# Patient Record
Sex: Female | Born: 1955 | Race: Black or African American | Hispanic: No | Marital: Single | Smoking: Never smoker
Health system: Southern US, Community
[De-identification: ages and names within clinical notes are randomized; demographics above are authoritative.]

## PROBLEM LIST (undated history)

## (undated) DIAGNOSIS — E7800 Pure hypercholesterolemia, unspecified: Secondary | ICD-10-CM

## (undated) DIAGNOSIS — I10 Essential (primary) hypertension: Secondary | ICD-10-CM

## (undated) DIAGNOSIS — E119 Type 2 diabetes mellitus without complications: Secondary | ICD-10-CM

## (undated) HISTORY — PX: ABDOMINAL HYSTERECTOMY: SHX81

---

## 2003-03-02 ENCOUNTER — Other Ambulatory Visit: Payer: Self-pay

## 2004-03-12 ENCOUNTER — Ambulatory Visit: Payer: Self-pay | Admitting: Unknown Physician Specialty

## 2005-03-13 ENCOUNTER — Ambulatory Visit: Payer: Self-pay | Admitting: Unknown Physician Specialty

## 2006-03-14 ENCOUNTER — Ambulatory Visit: Payer: Self-pay | Admitting: Unknown Physician Specialty

## 2007-03-18 ENCOUNTER — Ambulatory Visit: Payer: Self-pay | Admitting: Unknown Physician Specialty

## 2008-04-01 ENCOUNTER — Ambulatory Visit: Payer: Self-pay | Admitting: Unknown Physician Specialty

## 2008-12-29 ENCOUNTER — Ambulatory Visit: Payer: Self-pay | Admitting: Gastroenterology

## 2009-05-31 ENCOUNTER — Ambulatory Visit: Payer: Self-pay | Admitting: Unknown Physician Specialty

## 2010-07-10 ENCOUNTER — Ambulatory Visit: Payer: Self-pay | Admitting: Unknown Physician Specialty

## 2011-05-01 ENCOUNTER — Ambulatory Visit: Payer: Self-pay | Admitting: Internal Medicine

## 2011-05-06 ENCOUNTER — Ambulatory Visit: Payer: Self-pay | Admitting: Internal Medicine

## 2011-05-06 LAB — CBC CANCER CENTER
Basophil #: 0 x10 3/mm (ref 0.0–0.1)
Eosinophil: 2 %
HCT: 37.4 % (ref 35.0–47.0)
Lymphocyte #: 2.1 x10 3/mm (ref 1.0–3.6)
Lymphocyte %: 52.3 %
MCH: 31.6 pg (ref 26.0–34.0)
MCHC: 33.9 g/dL (ref 32.0–36.0)
Monocyte #: 0.2 x10 3/mm (ref 0.0–0.7)
Monocyte %: 5.6 %
Monocytes: 7 %
Neutrophil #: 1.6 x10 3/mm (ref 1.4–6.5)
Neutrophil %: 40.4 %
Platelet: 186 x10 3/mm (ref 150–440)
RDW: 14 % (ref 11.5–14.5)
Segmented Neutrophils: 33 %

## 2011-05-06 LAB — RETICULOCYTES
Absolute Retic Count: 0.06 10*6/uL (ref 0.024–0.084)
Reticulocyte: 1.5 % (ref 0.5–1.5)

## 2011-05-06 LAB — FOLATE: Folic Acid: 31.9 ng/mL (ref 3.1–100.0)

## 2011-05-06 LAB — APTT: Activated PTT: 33.8 secs (ref 23.6–35.9)

## 2011-05-06 LAB — IRON AND TIBC
Iron Saturation: 38 %
Unbound Iron-Bind.Cap.: 172 ug/dL

## 2011-05-06 LAB — PROTIME-INR
INR: 1
Prothrombin Time: 13.2 secs (ref 11.5–14.7)

## 2011-05-17 ENCOUNTER — Ambulatory Visit: Payer: Self-pay | Admitting: Internal Medicine

## 2015-11-30 ENCOUNTER — Other Ambulatory Visit: Payer: Self-pay | Admitting: Family Medicine

## 2015-11-30 DIAGNOSIS — Z1231 Encounter for screening mammogram for malignant neoplasm of breast: Secondary | ICD-10-CM

## 2015-12-13 ENCOUNTER — Other Ambulatory Visit: Payer: Self-pay | Admitting: Family Medicine

## 2015-12-13 ENCOUNTER — Ambulatory Visit
Admission: RE | Admit: 2015-12-13 | Discharge: 2015-12-13 | Disposition: A | Discharge status: Home / Self Care | Payer: BLUE CROSS/BLUE SHIELD | Source: Ambulatory Visit | Attending: Family Medicine | Admitting: Family Medicine

## 2015-12-13 DIAGNOSIS — Z1231 Encounter for screening mammogram for malignant neoplasm of breast: Secondary | ICD-10-CM | POA: Diagnosis present

## 2016-09-18 ENCOUNTER — Encounter: Payer: Self-pay | Admitting: Emergency Medicine

## 2016-09-18 ENCOUNTER — Ambulatory Visit (INDEPENDENT_AMBULATORY_CARE_PROVIDER_SITE_OTHER): Payer: BLUE CROSS/BLUE SHIELD

## 2016-09-18 ENCOUNTER — Ambulatory Visit
Admission: EM | Admit: 2016-09-18 | Discharge: 2016-09-18 | Disposition: A | Discharge status: Home / Self Care | Payer: BLUE CROSS/BLUE SHIELD | Attending: Family Medicine | Admitting: Family Medicine

## 2016-09-18 DIAGNOSIS — M7582 Other shoulder lesions, left shoulder: Secondary | ICD-10-CM | POA: Diagnosis not present

## 2016-09-18 HISTORY — DX: Essential (primary) hypertension: I10

## 2016-09-18 MED ORDER — NAPROXEN 500 MG PO TABS: 500.0000 mg | ORAL_TABLET | Freq: Two times a day (BID) | ORAL | 0 refills | Status: AC

## 2016-09-18 NOTE — ED Provider Notes (Signed)
CSN: [id number]     Arrival date & time 09/18/16  0802 History   First MD Initiated Contact with Patient 09/18/16 [phone]     Chief Complaint  Patient presents with  . Shoulder Pain    left   (Consider location/radiation/quality/duration/timing/severity/associated sxs/prior Treatment) HPI   This 61 year old female who presents with left shoulder pain that she's had off and on for a couple of months. 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 Many times she does not have help to assist her. He states that the pain will radiate into her upper arm. She is having difficulty lifting her arm away from her side up. She is able to move it through a range of motion with the assistance of another hand. She also states that sometimes the pain will affect her right shoulder and other times left. Neck is also bothersome at times. She denies any upper extremity radicular symptoms and denies any numbness or tingling. She states that heat does help the pain as does Tylenol but always seems to return.     Past Medical History:  Diagnosis Date  . Hypertension    Past Surgical History:  Procedure Laterality Date  . ABDOMINAL HYSTERECTOMY     Family History  Problem Relation Age of Onset  . Breast cancer Sister 17  . Cancer Mother   . Hypertension Mother   . Hypertension Father    Social History  Substance Use Topics  . Smoking status: Never Smoker  . Smokeless tobacco: Never Used  . Alcohol use No   OB History    No data available     Review of Systems  Constitutional: Positive for activity change. Negative for chills, fatigue and fever.  Musculoskeletal: Positive for arthralgias and neck stiffness.  All other systems reviewed and are negative.   Allergies  Patient has no known allergies.  Home Medications   Prior to Admission medications   Medication Sig Start Date End Date Taking?  Authorizing Provider  amLODipine (NORVASC) 5 MG tablet Take 5 mg by mouth daily.   Yes [provider]  benazepril-hydrochlorthiazide (LOTENSIN HCT) 10-12.5 MG tablet Take 1 tablet by mouth daily.   Yes [provider]  naproxen (NAPROSYN) 500 MG tablet Take 1 tablet (500 mg total) by mouth 2 (two) times daily with a meal. 09/18/16   Lutricia Feil, PA-C   Meds Ordered and Administered this Visit  Medications - No data to display  BP (!) 150/73 (BP Location: Right Arm)   Pulse 73   Temp 98.7 F (37.1 C) (Oral)   Resp 16   Ht 5\' 10"  (1.778 m)   Wt 190 lb (86.2 kg)   SpO2 100%   BMI 27.26 kg/m  No data found.   Physical Exam  Constitutional: She is oriented to person, place, and time. She appears well-developed and well-nourished. No distress.  HENT:  Head: Normocephalic and atraumatic.  Eyes: EOM are normal. Pupils are equal, round, and reactive to light.  Neck: Normal range of motion. Neck supple.  Examination of the cervical spine shows fairly good range of motion without discomfort. She does have tenderness of the trapezii bilaterally. Upper extremity sensation is intact. Range of motion please refer to musculoskeletal  Musculoskeletal:  Examination of the shoulders shows full range of motion on the right without discomfort. Patient is able to arm raise on the right without  difficulty. Has a negative empty can test on the right. Left shoulder range of motion shows the patient to not be unable to actively move her arm beyond 25 of abduction I extension. External rotation beyond 15 causes her pain in the shoulder. Hip internal rotation is full and comfortable. She has a positive empty can test on the left against resistance but is hazy able to hold it solidly against gravity. Upper extremity sensation is intact. Strength is weak due to patient's discomfort.  Neurological: She is alert and oriented to person, place, and time.  Skin: Skin is dry. She is not  diaphoretic.  Psychiatric: She has a normal mood and affect. Her behavior is normal. Thought content normal.  Nursing note and vitals reviewed.   Urgent Care Course     Procedures (including critical care time)  Labs Review Labs Reviewed - No data to display  Imaging Review Dg Shoulder Left  Result Date: 09/18/2016 CLINICAL DATA:  Left shoulder pain, no known injury, initial encounter EXAM: LEFT SHOULDER - 2+ VIEW COMPARISON:  None. FINDINGS: There is no evidence of fracture or dislocation. There is no evidence of arthropathy or other focal bone abnormality. Soft tissues are unremarkable. IMPRESSION: No acute abnormality noted. Electronically Signed   By: Alcide CleverMark  Lukens M.D.   On: 09/18/2016 09:15     Visual Acuity Review  Right Eye Distance:   Left Eye Distance:   Bilateral Distance:    Right Eye Near:   Left Eye Near:    Bilateral Near:         MDM   1. Infraspinatus tendinitis, left    Discharge Medication List as of 09/18/2016  9:48 AM    START taking these medications   Details  naproxen (NAPROSYN) 500 MG tablet Take 1 tablet (500 mg total) by mouth 2 (two) times daily with a meal., Starting Wed 09/18/2016, Normal      Plan: 1. Test/x-ray results and diagnosis reviewed with patient 2. rx as per orders; risks, benefits, potential side effects reviewed with patient 3. Recommend supportive treatment with Symptom avoidance and rest as necessary. Patient was instructed in pendulum exercises to help reduce risk of adhesive capsulitis. If she does not improve with the conservative treatment I have recommended that she follow-up with her primary care for possible referral to orthopedics. 4. F/u prn if symptoms worsen or don't improve     Lutricia FeilRoemer, Phillipa Morden P, PA-C 09/18/16 [id number]

## 2016-09-18 NOTE — ED Triage Notes (Signed)
Patient c/o left shoulder pain off and on for couple of months.

## 2016-11-05 ENCOUNTER — Other Ambulatory Visit: Payer: Self-pay | Admitting: Family Medicine

## 2016-11-05 DIAGNOSIS — Z1231 Encounter for screening mammogram for malignant neoplasm of breast: Secondary | ICD-10-CM

## 2016-12-13 ENCOUNTER — Ambulatory Visit
Admission: RE | Admit: 2016-12-13 | Discharge: 2016-12-13 | Disposition: A | Discharge status: Home / Self Care | Payer: BLUE CROSS/BLUE SHIELD | Source: Ambulatory Visit | Attending: Family Medicine | Admitting: Family Medicine

## 2016-12-13 DIAGNOSIS — Z1231 Encounter for screening mammogram for malignant neoplasm of breast: Secondary | ICD-10-CM | POA: Insufficient documentation

## 2016-12-19 ENCOUNTER — Other Ambulatory Visit: Payer: Self-pay | Admitting: Family Medicine

## 2016-12-19 DIAGNOSIS — R928 Other abnormal and inconclusive findings on diagnostic imaging of breast: Secondary | ICD-10-CM

## 2016-12-19 DIAGNOSIS — N6489 Other specified disorders of breast: Secondary | ICD-10-CM

## 2016-12-26 ENCOUNTER — Ambulatory Visit
Admission: RE | Admit: 2016-12-26 | Discharge: 2016-12-26 | Disposition: A | Discharge status: Home / Self Care | Payer: BLUE CROSS/BLUE SHIELD | Source: Ambulatory Visit | Attending: Family Medicine | Admitting: Family Medicine

## 2016-12-26 DIAGNOSIS — R928 Other abnormal and inconclusive findings on diagnostic imaging of breast: Secondary | ICD-10-CM

## 2016-12-26 DIAGNOSIS — N6489 Other specified disorders of breast: Secondary | ICD-10-CM | POA: Diagnosis not present

## 2017-12-19 ENCOUNTER — Other Ambulatory Visit: Payer: Self-pay | Admitting: Family Medicine

## 2017-12-19 DIAGNOSIS — Z1231 Encounter for screening mammogram for malignant neoplasm of breast: Secondary | ICD-10-CM

## 2018-01-07 ENCOUNTER — Ambulatory Visit
Admission: RE | Admit: 2018-01-07 | Discharge: 2018-01-07 | Disposition: A | Discharge status: Home / Self Care | Payer: BLUE CROSS/BLUE SHIELD | Source: Ambulatory Visit | Attending: Family Medicine | Admitting: Family Medicine

## 2018-01-07 DIAGNOSIS — Z1231 Encounter for screening mammogram for malignant neoplasm of breast: Secondary | ICD-10-CM | POA: Insufficient documentation

## 2018-09-29 ENCOUNTER — Other Ambulatory Visit: Payer: Self-pay | Admitting: Internal Medicine

## 2018-09-29 DIAGNOSIS — Z1231 Encounter for screening mammogram for malignant neoplasm of breast: Secondary | ICD-10-CM

## 2019-01-11 ENCOUNTER — Ambulatory Visit
Admission: RE | Admit: 2019-01-11 | Discharge: 2019-01-11 | Disposition: A | Discharge status: Home / Self Care | Payer: BC Managed Care – PPO | Source: Ambulatory Visit | Attending: Internal Medicine | Admitting: Internal Medicine

## 2019-01-11 DIAGNOSIS — Z1231 Encounter for screening mammogram for malignant neoplasm of breast: Secondary | ICD-10-CM | POA: Diagnosis present

## 2019-09-30 ENCOUNTER — Other Ambulatory Visit: Payer: Self-pay | Admitting: Internal Medicine

## 2019-09-30 DIAGNOSIS — Z1231 Encounter for screening mammogram for malignant neoplasm of breast: Secondary | ICD-10-CM

## 2020-01-17 ENCOUNTER — Other Ambulatory Visit: Payer: Self-pay

## 2020-01-17 ENCOUNTER — Ambulatory Visit
Admission: RE | Admit: 2020-01-17 | Discharge: 2020-01-17 | Disposition: A | Discharge status: Home / Self Care | Payer: BC Managed Care – PPO | Source: Ambulatory Visit | Attending: Internal Medicine | Admitting: Internal Medicine

## 2020-01-17 DIAGNOSIS — Z1231 Encounter for screening mammogram for malignant neoplasm of breast: Secondary | ICD-10-CM | POA: Insufficient documentation

## 2021-02-20 ENCOUNTER — Other Ambulatory Visit: Payer: Self-pay | Admitting: Gerontology

## 2021-02-20 DIAGNOSIS — Z1231 Encounter for screening mammogram for malignant neoplasm of breast: Secondary | ICD-10-CM

## 2021-04-25 ENCOUNTER — Ambulatory Visit
Admission: RE | Admit: 2021-04-25 | Discharge: 2021-04-25 | Disposition: A | Discharge status: Home / Self Care | Payer: BC Managed Care – PPO | Source: Ambulatory Visit | Attending: Gerontology | Admitting: Gerontology

## 2021-04-25 ENCOUNTER — Other Ambulatory Visit: Payer: Self-pay

## 2021-04-25 DIAGNOSIS — Z1231 Encounter for screening mammogram for malignant neoplasm of breast: Secondary | ICD-10-CM | POA: Insufficient documentation

## 2021-05-18 ENCOUNTER — Other Ambulatory Visit: Payer: Self-pay | Admitting: Internal Medicine

## 2021-05-18 DIAGNOSIS — K529 Noninfective gastroenteritis and colitis, unspecified: Secondary | ICD-10-CM

## 2021-05-21 ENCOUNTER — Other Ambulatory Visit: Payer: Self-pay | Admitting: Nurse Practitioner

## 2021-05-21 DIAGNOSIS — K529 Noninfective gastroenteritis and colitis, unspecified: Secondary | ICD-10-CM

## 2021-05-31 ENCOUNTER — Ambulatory Visit: Payer: BC Managed Care – PPO

## 2021-05-31 ENCOUNTER — Ambulatory Visit
Admission: RE | Admit: 2021-05-31 | Discharge: 2021-05-31 | Disposition: A | Discharge status: Home / Self Care | Payer: BC Managed Care – PPO | Source: Ambulatory Visit | Attending: Internal Medicine | Admitting: Internal Medicine

## 2021-05-31 DIAGNOSIS — K529 Noninfective gastroenteritis and colitis, unspecified: Secondary | ICD-10-CM

## 2021-05-31 MED ORDER — IOPAMIDOL (ISOVUE-300) INJECTION 61%
100.0000 mL | Freq: Once | INTRAVENOUS | Status: AC | PRN
  Administered 2021-05-31: 100 mL via INTRAVENOUS

## 2021-07-12 ENCOUNTER — Other Ambulatory Visit: Payer: Self-pay

## 2021-07-12 ENCOUNTER — Ambulatory Visit
Admission: EM | Admit: 2021-07-12 | Discharge: 2021-07-12 | Disposition: A | Discharge status: Home / Self Care | Payer: BC Managed Care – PPO

## 2021-07-12 DIAGNOSIS — J40 Bronchitis, not specified as acute or chronic: Secondary | ICD-10-CM | POA: Diagnosis not present

## 2021-07-12 DIAGNOSIS — J329 Chronic sinusitis, unspecified: Secondary | ICD-10-CM | POA: Diagnosis not present

## 2021-07-12 MED ORDER — AMOXICILLIN-POT CLAVULANATE 875-125 MG PO TABS: 1.0000 | ORAL_TABLET | Freq: Two times a day (BID) | ORAL | 0 refills | Status: DC

## 2021-07-12 NOTE — Discharge Instructions (Addendum)
-  Start the antibiotic-Augmentin (amoxicillin-clavulanate), 1 pill every 12 hours for 7 days.  You can take this with food like with breakfast and dinner. ?-Follow-up if symptoms worsen instead of improve: shortness of breath, new chest pain, new fevers, coughing up red or brown, etc.  ?-You can also try an over-the-counter allergy medication if sneezing persists  ?

## 2021-07-12 NOTE — ED Provider Notes (Signed)
?[facility] ? ? ? ?CSN: [id number] ?Arrival date & time: 07/12/21  1850 ? ? ?  ? ?History   ?Chief Complaint ?Chief Complaint  ?Patient presents with  ? Sore Throat  ?   ?  ? Cough  ? ? ?HPI ?Danielle Keller is a 66 y.o. female presenting with worsening symptoms following 10 days of a viral URI.  Describes initially with nasal congestion, sore throat, cough.  Symptoms have improved, but in the last day they have once again worsened with purulent postnasal drip and nonproductive cough.  States that the sore throat has resolved, and she is not having shortness of breath, chest pain, fever/chills.  States Claritin provides temporary relief and the symptoms return. ? ?HPI ? ?Past Medical History:  ?Diagnosis Date  ? Hypertension   ? ? ?There are no problems to display for this patient. ? ? ?Past Surgical History:  ?Procedure Laterality Date  ? ABDOMINAL HYSTERECTOMY    ? ? ?OB History   ?No obstetric history on file. ?  ? ? ? ?Home Medications   ? ?Prior to Admission medications   ?Medication Sig Start Date End Date Taking? Authorizing Provider  ?amoxicillin-clavulanate (AUGMENTIN) 875-125 MG tablet Take 1 tablet by mouth every 12 (twelve) hours. 07/12/21  Yes Rhys Martini, PA-C  ?pravastatin (PRAVACHOL) 20 MG tablet Take by mouth. 02/20/21  Yes [provider]  ?amLODipine (NORVASC) 5 MG tablet Take 5 mg by mouth daily.    [provider]  ?benazepril-hydrochlorthiazide (LOTENSIN HCT) 10-12.5 MG tablet Take 1 tablet by mouth daily.    [provider]  ?metFORMIN (GLUCOPHAGE) 500 MG tablet SMARTSIG:1 Tablet(s) By Mouth Every Evening 06/07/21   [provider]  ?naproxen (NAPROSYN) 500 MG tablet Take 1 tablet (500 mg total) by mouth 2 (two) times daily with a meal. 09/18/16   Lutricia Feil, PA-C  ? ? ?Family History ?Family History  ?Problem Relation Age of Onset  ? Breast cancer Sister 71  ? Cancer Mother   ? Hypertension Mother   ? Hypertension Father   ? ? ?Social  History ?Social History  ? ?Tobacco Use  ? Smoking status: Never  ? Smokeless tobacco: Never  ?Vaping Use  ? Vaping Use: Never used  ?Substance Use Topics  ? Alcohol use: No  ? Drug use: No  ? ? ? ?Allergies   ?Patient has no known allergies. ? ? ?Review of Systems ?Review of Systems  ?Constitutional:  Negative for appetite change, chills and fever.  ?HENT:  Negative for congestion, ear pain, rhinorrhea, sinus pressure, sinus pain and sore throat.   ?Eyes:  Negative for redness and visual disturbance.  ?Respiratory:  Positive for cough. Negative for chest tightness, shortness of breath and wheezing.   ?Cardiovascular:  Negative for chest pain and palpitations.  ?Gastrointestinal:  Negative for abdominal pain, constipation, diarrhea, nausea and vomiting.  ?Genitourinary:  Negative for dysuria, frequency and urgency.  ?Musculoskeletal:  Negative for myalgias.  ?Neurological:  Negative for dizziness, weakness and headaches.  ?Psychiatric/Behavioral:  Negative for confusion.   ?All other systems reviewed and are negative. ? ? ?Physical Exam ?Triage Vital Signs ?ED Triage Vitals [07/12/21 1929]  ?Enc Vitals Group  ?   BP (!) 158/83  ?   Pulse Rate 82  ?   Resp 18  ?   Temp 98.7 ?F (37.1 ?C)  ?   Temp Source Oral  ?   SpO2 100 %  ?   Weight   ?  Height   ?   Head Circumference   ?   Peak Flow   ?   Pain Score   ?   Pain Loc   ?   Pain Edu?   ?   Excl. in GC?   ? ?No data found. ? ?Updated Vital Signs ?BP (!) 158/83 (BP Location: Left Arm)   Pulse 82   Temp 98.7 ?F (37.1 ?C) (Oral)   Resp 18   SpO2 100%  ? ?Visual Acuity ?Right Eye Distance:   ?Left Eye Distance:   ?Bilateral Distance:   ? ?Right Eye Near:   ?Left Eye Near:    ?Bilateral Near:    ? ?Physical Exam ?Vitals reviewed.  ?Constitutional:   ?   General: She is not in acute distress. ?   Appearance: Normal appearance. She is not ill-appearing.  ?HENT:  ?   Head: Normocephalic and atraumatic.  ?   Right Ear: Tympanic membrane, ear canal and external ear  normal. No tenderness. No middle ear effusion. There is no impacted cerumen. Tympanic membrane is not perforated, erythematous, retracted or bulging.  ?   Left Ear: Tympanic membrane, ear canal and external ear normal. No tenderness.  No middle ear effusion. There is no impacted cerumen. Tympanic membrane is not perforated, erythematous, retracted or bulging.  ?   Nose: Nose normal. No congestion.  ?   Mouth/Throat:  ?   Mouth: Mucous membranes are moist.  ?   Pharynx: Uvula midline. Posterior oropharyngeal erythema present. No oropharyngeal exudate.  ?   Comments: Cobblestoning posterior pharynx. Tonsils are small. ?Eyes:  ?   Extraocular Movements: Extraocular movements intact.  ?   Pupils: Pupils are equal, round, and reactive to light.  ?Cardiovascular:  ?   Rate and Rhythm: Normal rate and regular rhythm.  ?   Heart sounds: Normal heart sounds.  ?Pulmonary:  ?   Effort: Pulmonary effort is normal.  ?   Breath sounds: Normal breath sounds. No decreased breath sounds, wheezing, rhonchi or rales.  ?Abdominal:  ?   Palpations: Abdomen is soft.  ?   Tenderness: There is no abdominal tenderness. There is no guarding or rebound.  ?Lymphadenopathy:  ?   Cervical: No cervical adenopathy.  ?   Right cervical: No superficial cervical adenopathy. ?   Left cervical: No superficial cervical adenopathy.  ?Neurological:  ?   General: No focal deficit present.  ?   Mental Status: She is alert and oriented to person, place, and time.  ?Psychiatric:     ?   Mood and Affect: Mood normal.     ?   Behavior: Behavior normal.     ?   Thought Content: Thought content normal.     ?   Judgment: Judgment normal.  ? ? ? ?UC Treatments / Results  ?Labs ?(all labs ordered are listed, but only abnormal results are displayed) ?Labs Reviewed  ?GROUP A STREP BY PCR  ? ? ?EKG ? ? ?Radiology ?No results found. ? ?Procedures ?Procedures (including critical care time) ? ?Medications Ordered in UC ?Medications - No data to display ? ?Initial  Impression / Assessment and Plan / UC Course  ?I have reviewed the triage vital signs and the nursing notes. ? ?Pertinent labs & imaging results that were available during my care of the patient were reviewed by me and considered in my medical decision making (see chart for details). ? ?  ? ?This patient is a very pleasant 66 y.o. year old female  presenting with acute sinusitis following viral syndrome / secondary sickening syndrome. Afebrile, nontachy. Augmentin sent. Continue daily claritin. ED return precautions discussed. Patient verbalizes understanding and agreement.  ?.  ? ?Final Clinical Impressions(s) / UC Diagnoses  ? ?Final diagnoses:  ?Sinobronchitis  ? ? ? ?Discharge Instructions   ? ?  ?-Start the antibiotic-Augmentin (amoxicillin-clavulanate), 1 pill every 12 hours for 7 days.  You can take this with food like with breakfast and dinner. ?-Follow-up if symptoms worsen instead of improve: shortness of breath, new chest pain, new fevers, coughing up red or brown, etc.  ?-You can also try an over-the-counter allergy medication if sneezing persists  ? ? ?ED Prescriptions   ? ? Medication Sig Dispense Auth. Provider  ? amoxicillin-clavulanate (AUGMENTIN) 875-125 MG tablet Take 1 tablet by mouth every 12 (twelve) hours. 14 tablet Rhys MartiniGraham, Iyanla Eilers E, PA-C  ? ?  ? ?PDMP not reviewed this encounter. ?  ?Rhys MartiniGraham, Stillman Buenger E, PA-C ?07/12/21 1943 ? ?

## 2021-07-12 NOTE — ED Triage Notes (Signed)
Pt reports itching  throat, nasal drainage and cough x 10 days. States she do not have sore throat anymore.  ?

## 2021-12-11 ENCOUNTER — Ambulatory Visit
Admission: EM | Admit: 2021-12-11 | Discharge: 2021-12-11 | Disposition: A | Discharge status: Home / Self Care | Payer: BC Managed Care – PPO | Attending: Family Medicine | Admitting: Family Medicine

## 2021-12-11 DIAGNOSIS — L300 Nummular dermatitis: Secondary | ICD-10-CM | POA: Diagnosis not present

## 2021-12-11 HISTORY — DX: Pure hypercholesterolemia, unspecified: E78.00

## 2021-12-11 HISTORY — DX: Type 2 diabetes mellitus without complications: E11.9

## 2021-12-11 MED ORDER — TRIAMCINOLONE ACETONIDE 0.5 % EX OINT: 1.0000 | TOPICAL_OINTMENT | Freq: Two times a day (BID) | CUTANEOUS | 3 refills | Status: AC

## 2021-12-11 NOTE — Discharge Instructions (Addendum)
Stop by the pharmacy to pick up your steroid ointment.  Apply twice a day for up to 14 days.  If your rash is not improving in the next week please follow-up with your primary care provider/dermatologist.  See information attached.

## 2021-12-11 NOTE — ED Provider Notes (Signed)
[facility]    CSN: [id number] Arrival date & time: 12/11/21  1436      History   Chief Complaint Chief Complaint  Patient presents with   Rash    HPI Danielle Keller is a 66 y.o. female.   HPI  [name] presents for itchy bilateral foot rash that started about 1 month ago. Has been using hydrocortisone, lemon butter and Aveeno lotion.  Treatments helped somewhat with the itching.  Denies fever, swelling, vomiting, diarrhea, abdominal pain, headache, sore throat, joint pain and shortness of breath.  She has been eating and drinking, per normal.  She has no other concerns today.     Past Medical History:  Diagnosis Date   Diabetes mellitus without complication (HCC)    High cholesterol    Hypertension     There are no problems to display for this patient.   Past Surgical History:  Procedure Laterality Date   ABDOMINAL HYSTERECTOMY      OB History   No obstetric history on file.      Home Medications    Prior to Admission medications   Medication Sig Start Date End Date Taking? Authorizing Provider  amLODipine (NORVASC) 5 MG tablet Take 5 mg by mouth daily.   Yes [provider]  benazepril-hydrochlorthiazide (LOTENSIN HCT) 10-12.5 MG tablet Take 1 tablet by mouth daily.   Yes [provider]  metFORMIN (GLUCOPHAGE) 500 MG tablet SMARTSIG:1 Tablet(s) By Mouth Every Evening 06/07/21  Yes [provider]  naproxen (NAPROSYN) 500 MG tablet Take 1 tablet (500 mg total) by mouth 2 (two) times daily with a meal. 09/18/16  Yes Lutricia Feil, PA-C  pravastatin (PRAVACHOL) 20 MG tablet Take by mouth. 02/20/21  Yes [provider]  triamcinolone ointment (KENALOG) 0.5 % Apply 1 Application topically 2 (two) times daily. For moderate to severe eczema.  Do not use for more than 1 week at a time. 12/11/21  Yes Katha Cabal, DO    Family History Family History  Problem Relation Age of Onset   Breast cancer Sister 23    Cancer Mother    Hypertension Mother    Hypertension Father     Social History Social History   Tobacco Use   Smoking status: Never   Smokeless tobacco: Never  Vaping Use   Vaping Use: Never used  Substance Use Topics   Alcohol use: No   Drug use: No     Allergies   Patient has no known allergies.   Review of Systems Review of Systems : :negative unless otherwise stated in HPI.      Physical Exam Triage Vital Signs ED Triage Vitals  Enc Vitals Group     BP 12/11/21 1551 130/68     Pulse Rate 12/11/21 1551 61     Resp 12/11/21 1551 18     Temp 12/11/21 1551 98.4 F (36.9 C)     Temp Source 12/11/21 1551 Oral     SpO2 12/11/21 1551 100 %     Weight --      Height --      Head Circumference --      Peak Flow --      Pain Score 12/11/21 1549 0     Pain Loc --      Pain Edu? --      Excl. in GC? --    No data found.  Updated Vital Signs BP 130/68 (BP Location: Left Arm)   Pulse 61  Temp 98.4 F (36.9 C) (Oral)   Resp 18   SpO2 100%   Visual Acuity Right Eye Distance:   Left Eye Distance:   Bilateral Distance:    Right Eye Near:   Left Eye Near:    Bilateral Near:     Physical Exam  GEN: well appearing female in no acute distress  CVS: well perfused  RESP: speaking in full sentences without pause, no respiratory distress  SKIN: Bilateral ankles with raised hyperpigmented patches that are circular and scaly and somewhat blisterlike, she has a couple lesions behind her knee          UC Treatments / Results  Labs (all labs ordered are listed, but only abnormal results are displayed) Labs Reviewed - No data to display  EKG   Radiology No results found.  Procedures Procedures (including critical care time)  Medications Ordered in UC Medications - No data to display  Initial Impression / Assessment and Plan / UC Course  I have reviewed the triage vital signs and the nursing notes.  Pertinent labs & imaging results that were  available during my care of the patient were reviewed by me and considered in my medical decision making (see chart for details).     Patient is a 66 year old female who presents for bilateral foot rash that started about a month ago.  Vital signs stable.  She is afebrile.  She has been using hydrocortisone without relief.  She has history of eczema.  Her exam is concerning for nummular eczema as lesions are both on bilateral feet and behind her knees.  This does not appear to be infectious.  No antibiotics needed at this time.  Possibly vasculitis as she does have some lower extremity swelling.  Treat with Kenalog ointment 0.5%.  Patient to follow-up with her primary care doctor and/or dermatologist if not improving.  Return precautions provided Final Clinical Impressions(s) / UC Diagnoses   Final diagnoses:  Nummular eczema     Discharge Instructions      Stop by the pharmacy to pick up your steroid ointment.  Apply twice a day for up to 14 days.  If your rash is not improving in the next week please follow-up with your primary care provider/dermatologist.  See information attached.       ED Prescriptions     Medication Sig Dispense Auth. Provider   triamcinolone ointment (KENALOG) 0.5 % Apply 1 Application topically 2 (two) times daily. For moderate to severe eczema.  Do not use for more than 1 week at a time. 60 g Katha Cabal, DO      PDMP not reviewed this encounter.   Katha Cabal, DO 12/12/21 0302

## 2021-12-11 NOTE — ED Triage Notes (Signed)
Pt presents with rash to feet and legs x 1 month.  Believes it is eczema exacerbation.  Itching.  Had been given a cream years ago that helped but doesn't have any more and unsure of name.

## 2022-03-12 ENCOUNTER — Other Ambulatory Visit: Payer: Self-pay | Admitting: Gerontology

## 2022-03-12 DIAGNOSIS — Z1231 Encounter for screening mammogram for malignant neoplasm of breast: Secondary | ICD-10-CM

## 2022-05-06 ENCOUNTER — Ambulatory Visit
Admission: RE | Admit: 2022-05-06 | Discharge: 2022-05-06 | Disposition: A | Discharge status: Home / Self Care | Payer: BC Managed Care – PPO | Source: Ambulatory Visit | Attending: Gerontology | Admitting: Gerontology

## 2022-05-06 DIAGNOSIS — Z1231 Encounter for screening mammogram for malignant neoplasm of breast: Secondary | ICD-10-CM | POA: Diagnosis not present

## 2022-05-10 ENCOUNTER — Other Ambulatory Visit: Payer: Self-pay | Admitting: Gerontology

## 2022-05-10 DIAGNOSIS — R928 Other abnormal and inconclusive findings on diagnostic imaging of breast: Secondary | ICD-10-CM

## 2022-05-10 DIAGNOSIS — N6489 Other specified disorders of breast: Secondary | ICD-10-CM

## 2022-05-14 ENCOUNTER — Ambulatory Visit
Admission: RE | Admit: 2022-05-14 | Discharge: 2022-05-14 | Disposition: A | Discharge status: Home / Self Care | Payer: BC Managed Care – PPO | Source: Ambulatory Visit | Attending: Gerontology | Admitting: Gerontology

## 2022-05-14 DIAGNOSIS — N6489 Other specified disorders of breast: Secondary | ICD-10-CM

## 2022-05-14 DIAGNOSIS — R928 Other abnormal and inconclusive findings on diagnostic imaging of breast: Secondary | ICD-10-CM

## 2022-06-21 ENCOUNTER — Ambulatory Visit
Admission: EM | Admit: 2022-06-21 | Discharge: 2022-06-21 | Disposition: A | Discharge status: Home / Self Care | Payer: BC Managed Care – PPO | Attending: Family Medicine | Admitting: Family Medicine

## 2022-06-21 ENCOUNTER — Other Ambulatory Visit: Payer: Self-pay

## 2022-06-21 DIAGNOSIS — J3489 Other specified disorders of nose and nasal sinuses: Secondary | ICD-10-CM | POA: Diagnosis present

## 2022-06-21 DIAGNOSIS — Z1152 Encounter for screening for COVID-19: Secondary | ICD-10-CM | POA: Insufficient documentation

## 2022-06-21 DIAGNOSIS — R059 Cough, unspecified: Secondary | ICD-10-CM | POA: Diagnosis present

## 2022-06-21 DIAGNOSIS — J069 Acute upper respiratory infection, unspecified: Secondary | ICD-10-CM | POA: Diagnosis not present

## 2022-06-21 LAB — RESP PANEL BY RT-PCR (RSV, FLU A&B, COVID)  RVPGX2
Influenza A by PCR: NEGATIVE
Influenza B by PCR: NEGATIVE
Resp Syncytial Virus by PCR: NEGATIVE
SARS Coronavirus 2 by RT PCR: NEGATIVE

## 2022-06-21 MED ORDER — IPRATROPIUM BROMIDE 0.06 % NA SOLN: 2.0000 | Freq: Four times a day (QID) | NASAL | 12 refills | Status: AC

## 2022-06-21 MED ORDER — PROMETHAZINE-DM 6.25-15 MG/5ML PO SYRP: 5.0000 mL | ORAL_SOLUTION | Freq: Four times a day (QID) | ORAL | 0 refills | Status: AC | PRN

## 2022-06-21 NOTE — ED Triage Notes (Signed)
Nasal drainage with Itchy, sore, scratchy throat since Tuesday. Pt states she got some relief from zyrtec but continues to have drainage. Denies fever

## 2022-06-21 NOTE — Discharge Instructions (Signed)
We will contact you if your COVID/influenza/RSV test is positive.  Please quarantine while you wait for the results.  If your test is negative you may resume normal activities.  If your test is positive please continue to quarantine for at least 5 days from your symptom onset or until you are without a fever for at least 24 hours after the medications.    Stop by the pharmacy to pick up your prescriptions.    You can take Tylenol and/or Ibuprofen as needed for fever reduction and pain relief.    For cough: honey 1/2 to 1 teaspoon (you can dilute the honey in water or another fluid).  You can also use guaifenesin and dextromethorphan for cough. You can use a humidifier for chest congestion and cough.  If you don't have a humidifier, you can sit in the bathroom with the hot shower running.      For sore throat: try warm salt water gargles, Mucinex sore throat cough drops or cepacol lozenges, throat spray, warm tea or water with lemon/honey, popsicles or ice, or OTC cold relief medicine for throat discomfort. You can also purchase chloraseptic spray at the pharmacy or dollar store.   For congestion: take a daily anti-histamine like Zyrtec, Claritin, and a oral decongestant, such as pseudoephedrine.  You can also use Flonase 1-2 sprays in each nostril daily. Afrin is also a good option, if you do not have high blood pressure.    It is important to stay hydrated: drink plenty of fluids (water, gatorade/powerade/pedialyte, juices, or teas) to keep your throat moisturized and help further relieve irritation/discomfort.    Return or go to the Emergency Department if symptoms worsen or do not improve in the next few days

## 2022-06-21 NOTE — ED Provider Notes (Signed)
[facility]    CSN: [id number] Arrival date & time: 06/21/22  1652      History   Chief Complaint Chief Complaint  Patient presents with   sinus drainage    HPI Danielle Keller is a 67 y.o. female.   HPI   Danielle Keller presents for sinus drainage and scratchy throat with sneezing, hoarseness and cough. Symptoms started on Tuesday. Took Claritin then Zyrtec which helped some.  Took Tylenol on Wednesday.  No known sick contacts.  She hasn't taken a COVID test.    Fever : no  Chills: no Sore throat: yes   Cough: yes Chest pain: no  Shortness of breath: no  Sputum: yes Nasal congestion: yes  Rhinorrhea: yes Myalgias: no Appetite: normal  Hydration: normal  Abdominal pain: no Nausea: no Vomiting: no Diarrhea: No Rash: Not new  Sleep disturbance: yes  Headache: no      Past Medical History:  Diagnosis Date   Diabetes mellitus without complication (HCC)    High cholesterol    Hypertension     There are no problems to display for this patient.   Past Surgical History:  Procedure Laterality Date   ABDOMINAL HYSTERECTOMY      OB History   No obstetric history on file.      Home Medications    Prior to Admission medications   Medication Sig Start Date End Date Taking? Authorizing Provider  ipratropium (ATROVENT) 0.06 % nasal spray Place 2 sprays into both nostrils 4 (four) times daily. 06/21/22  Yes Fines Kimberlin, DO  promethazine-dextromethorphan (PROMETHAZINE-DM) 6.25-15 MG/5ML syrup Take 5 mLs by mouth 4 (four) times daily as needed. 06/21/22  Yes Zadie Deemer, DO  amLODipine (NORVASC) 5 MG tablet Take 5 mg by mouth daily.    [provider]  benazepril-hydrochlorthiazide (LOTENSIN HCT) 10-12.5 MG tablet Take 1 tablet by mouth daily.    [provider]  metFORMIN (GLUCOPHAGE) 500 MG tablet SMARTSIG:1 Tablet(s) By Mouth Every Evening 06/07/21   [provider]  naproxen (NAPROSYN) 500 MG tablet Take 1 tablet (500 mg  total) by mouth 2 (two) times daily with a meal. 09/18/16   Lorin Picket, PA-C  pravastatin (PRAVACHOL) 20 MG tablet Take by mouth. 02/20/21   [provider]  triamcinolone ointment (KENALOG) 0.5 % Apply 1 Application topically 2 (two) times daily. For moderate to severe eczema.  Do not use for more than 1 week at a time. 12/11/21   Lyndee Hensen, DO    Family History Family History  Problem Relation Age of Onset   Breast cancer Sister 44   Cancer Mother    Hypertension Mother    Hypertension Father     Social History Social History   Tobacco Use   Smoking status: Never   Smokeless tobacco: Never  Vaping Use   Vaping Use: Never used  Substance Use Topics   Alcohol use: No   Drug use: No     Allergies   Patient has no known allergies.   Review of Systems Review of Systems: negative unless otherwise stated in HPI.      Physical Exam Triage Vital Signs ED Triage Vitals  Enc Vitals Group     BP 06/21/22 1703 126/66     Pulse Rate 06/21/22 1703 72     Resp 06/21/22 1703 20     Temp 06/21/22 1703 98.4 F (36.9 C)     Temp src --      SpO2 06/21/22 1703 100 %  Weight --      Height --      Head Circumference --      Peak Flow --      Pain Score 06/21/22 1701 2     Pain Loc --      Pain Edu? --      Excl. in [place]? --    No data found.  Updated Vital Signs BP 126/66   Pulse 72   Temp 98.4 F (36.9 C)   Resp 20   SpO2 100%   Visual Acuity Right Eye Distance:   Left Eye Distance:   Bilateral Distance:    Right Eye Near:   Left Eye Near:    Bilateral Near:     Physical Exam GEN:     alert, non-toxic appearing female in no distress    HENT:  mucus membranes moist, oropharyngeal without lesions or exudate, no tonsillar hypertrophy, mild oropharyngeal erythema,  moderate erythematous edematous turbinates, clear nasal discharge, bilateral TM normal EYES:   pupils equal and reactive, no scleral injection or discharge NECK:  normal ROM, no  lymphadenopathy, no meningismus   RESP:  no increased work of breathing, clear to auscultation bilaterally CVS:   regular rate and rhythm Skin:   warm and dry    UC Treatments / Results  Labs (all labs ordered are listed, but only abnormal results are displayed) Labs Reviewed  RESP PANEL BY RT-PCR (RSV, FLU A&B, COVID)  [id number]    EKG   Radiology No results found.  Procedures Procedures (including critical care time)  Medications Ordered in UC Medications - No data to display  Initial Impression / Assessment and Plan / UC Course  I have reviewed the triage vital signs and the nursing notes.  Pertinent labs & imaging results that were available during my care of the patient were reviewed by me and considered in my medical decision making (see chart for details).       Pt is a 67 y.o. female who presents for 2-3 days of respiratory symptoms. Danielle Keller is afebrile here. Satting well on room air. Overall pt is non-toxic appearing, well hydrated, without respiratory distress. Pulmonary exam is unremarkable.  COVID, RSV and influenza testing obtained and was negative. History consistent with viral respiratory illness. Discussed symptomatic treatment.  Explained lack of efficacy of antibiotics in viral disease.  Typical duration of symptoms discussed.  Promethazine DM for cough and Atrovent nasal spray for nasal congestion.  Return and ED precautions given and voiced understanding. Discussed MDM, treatment plan and plan for follow-up with patient who agrees with plan.     Final Clinical Impressions(s) / UC Diagnoses   Final diagnoses:  Viral URI with cough     Discharge Instructions      We will contact you if your COVID/influenza/RSV test is positive.  Please quarantine while you wait for the results.  If your test is negative you may resume normal activities.  If your test is positive please continue to quarantine for at least 5 days from your symptom onset or until you are  without a fever for at least 24 hours after the medications.    Stop by the pharmacy to pick up your prescriptions.    You can take Tylenol and/or Ibuprofen as needed for fever reduction and pain relief.    For cough: honey 1/2 to 1 teaspoon (you can dilute the honey in water or another fluid).  You can also use guaifenesin and dextromethorphan for cough. You can use  a humidifier for chest congestion and cough.  If you don't have a humidifier, you can sit in the bathroom with the hot shower running.      For sore throat: try warm salt water gargles, Mucinex sore throat cough drops or cepacol lozenges, throat spray, warm tea or water with lemon/honey, popsicles or ice, or OTC cold relief medicine for throat discomfort. You can also purchase chloraseptic spray at the pharmacy or dollar store.   For congestion: take a daily anti-histamine like Zyrtec, Claritin, and a oral decongestant, such as pseudoephedrine.  You can also use Flonase 1-2 sprays in each nostril daily. Afrin is also a good option, if you do not have high blood pressure.    It is important to stay hydrated: drink plenty of fluids (water, gatorade/powerade/pedialyte, juices, or teas) to keep your throat moisturized and help further relieve irritation/discomfort.    Return or go to the Emergency Department if symptoms worsen or do not improve in the next few days      ED Prescriptions     Medication Sig Dispense Auth. Provider   ipratropium (ATROVENT) 0.06 % nasal spray Place 2 sprays into both nostrils 4 (four) times daily. 15 mL Machelle Raybon, DO   promethazine-dextromethorphan (PROMETHAZINE-DM) 6.25-15 MG/5ML syrup Take 5 mLs by mouth 4 (four) times daily as needed. 118 mL Lyndee Hensen, DO      PDMP not reviewed this encounter.   Lyndee Hensen, DO 06/21/22 1808

## 2023-02-17 DIAGNOSIS — Z0100 Encounter for examination of eyes and vision without abnormal findings: Secondary | ICD-10-CM | POA: Diagnosis not present

## 2023-02-17 DIAGNOSIS — H25013 Cortical age-related cataract, bilateral: Secondary | ICD-10-CM | POA: Diagnosis not present

## 2023-02-27 DIAGNOSIS — E1159 Type 2 diabetes mellitus with other circulatory complications: Secondary | ICD-10-CM | POA: Diagnosis not present

## 2023-02-27 DIAGNOSIS — I152 Hypertension secondary to endocrine disorders: Secondary | ICD-10-CM | POA: Diagnosis not present

## 2023-02-27 DIAGNOSIS — Z0000 Encounter for general adult medical examination without abnormal findings: Secondary | ICD-10-CM | POA: Diagnosis not present

## 2023-02-27 DIAGNOSIS — R748 Abnormal levels of other serum enzymes: Secondary | ICD-10-CM | POA: Diagnosis not present

## 2023-02-27 DIAGNOSIS — I491 Atrial premature depolarization: Secondary | ICD-10-CM | POA: Diagnosis not present

## 2023-02-27 DIAGNOSIS — Z9189 Other specified personal risk factors, not elsewhere classified: Secondary | ICD-10-CM | POA: Diagnosis not present

## 2023-02-27 DIAGNOSIS — Z803 Family history of malignant neoplasm of breast: Secondary | ICD-10-CM | POA: Diagnosis not present

## 2023-02-27 DIAGNOSIS — R051 Acute cough: Secondary | ICD-10-CM | POA: Diagnosis not present

## 2023-02-27 DIAGNOSIS — D649 Anemia, unspecified: Secondary | ICD-10-CM | POA: Diagnosis not present

## 2023-02-27 DIAGNOSIS — K5790 Diverticulosis of intestine, part unspecified, without perforation or abscess without bleeding: Secondary | ICD-10-CM | POA: Diagnosis not present

## 2023-02-27 DIAGNOSIS — Z79899 Other long term (current) drug therapy: Secondary | ICD-10-CM | POA: Diagnosis not present

## 2023-02-27 DIAGNOSIS — E1169 Type 2 diabetes mellitus with other specified complication: Secondary | ICD-10-CM | POA: Diagnosis not present

## 2023-02-27 DIAGNOSIS — R0982 Postnasal drip: Secondary | ICD-10-CM | POA: Diagnosis not present

## 2023-02-27 DIAGNOSIS — N3281 Overactive bladder: Secondary | ICD-10-CM | POA: Diagnosis not present

## 2023-02-28 ENCOUNTER — Other Ambulatory Visit: Payer: Self-pay | Admitting: Gerontology

## 2023-02-28 DIAGNOSIS — Z1231 Encounter for screening mammogram for malignant neoplasm of breast: Secondary | ICD-10-CM

## 2023-05-09 ENCOUNTER — Ambulatory Visit
Admission: RE | Admit: 2023-05-09 | Discharge: 2023-05-09 | Disposition: A | Discharge status: Home / Self Care | Payer: HMO | Source: Ambulatory Visit | Attending: Gerontology

## 2023-05-09 DIAGNOSIS — Z1231 Encounter for screening mammogram for malignant neoplasm of breast: Secondary | ICD-10-CM | POA: Insufficient documentation

## 2023-06-18 DIAGNOSIS — L439 Lichen planus, unspecified: Secondary | ICD-10-CM | POA: Diagnosis not present

## 2023-08-27 DIAGNOSIS — E1169 Type 2 diabetes mellitus with other specified complication: Secondary | ICD-10-CM | POA: Diagnosis not present

## 2023-08-27 DIAGNOSIS — I152 Hypertension secondary to endocrine disorders: Secondary | ICD-10-CM | POA: Diagnosis not present

## 2023-08-27 DIAGNOSIS — I491 Atrial premature depolarization: Secondary | ICD-10-CM | POA: Diagnosis not present

## 2023-08-27 DIAGNOSIS — N3281 Overactive bladder: Secondary | ICD-10-CM | POA: Diagnosis not present

## 2023-08-27 DIAGNOSIS — Z09 Encounter for follow-up examination after completed treatment for conditions other than malignant neoplasm: Secondary | ICD-10-CM | POA: Diagnosis not present

## 2023-08-27 DIAGNOSIS — E1159 Type 2 diabetes mellitus with other circulatory complications: Secondary | ICD-10-CM | POA: Diagnosis not present

## 2023-08-27 DIAGNOSIS — Z9189 Other specified personal risk factors, not elsewhere classified: Secondary | ICD-10-CM | POA: Diagnosis not present

## 2023-10-06 DIAGNOSIS — E119 Type 2 diabetes mellitus without complications: Secondary | ICD-10-CM | POA: Diagnosis not present

## 2023-10-06 DIAGNOSIS — H25013 Cortical age-related cataract, bilateral: Secondary | ICD-10-CM | POA: Diagnosis not present

## 2023-10-06 DIAGNOSIS — H2513 Age-related nuclear cataract, bilateral: Secondary | ICD-10-CM | POA: Diagnosis not present

## 2023-11-03 IMAGING — MG MM DIGITAL SCREENING BILAT W/ TOMO AND CAD
8 series · 9 of 24 positions shown · non-contrast
Comparison: Previous exam(s).

CLINICAL DATA: Screening.

EXAM:
DIGITAL SCREENING BILATERAL MAMMOGRAM WITH TOMOSYNTHESIS AND CAD
TECHNIQUE: Bilateral screening digital craniocaudal and mediolateral oblique
mammograms were obtained. Bilateral screening digital breast
tomosynthesis was performed. The images were evaluated with
computer-aided detection.

[L CC synth-2D]
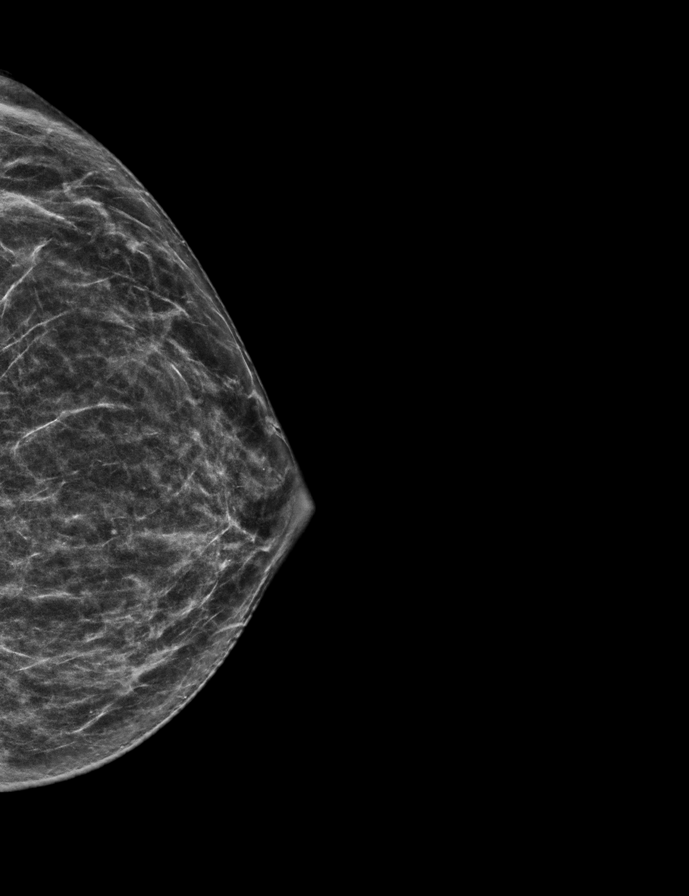

[L MLO synth-2D]
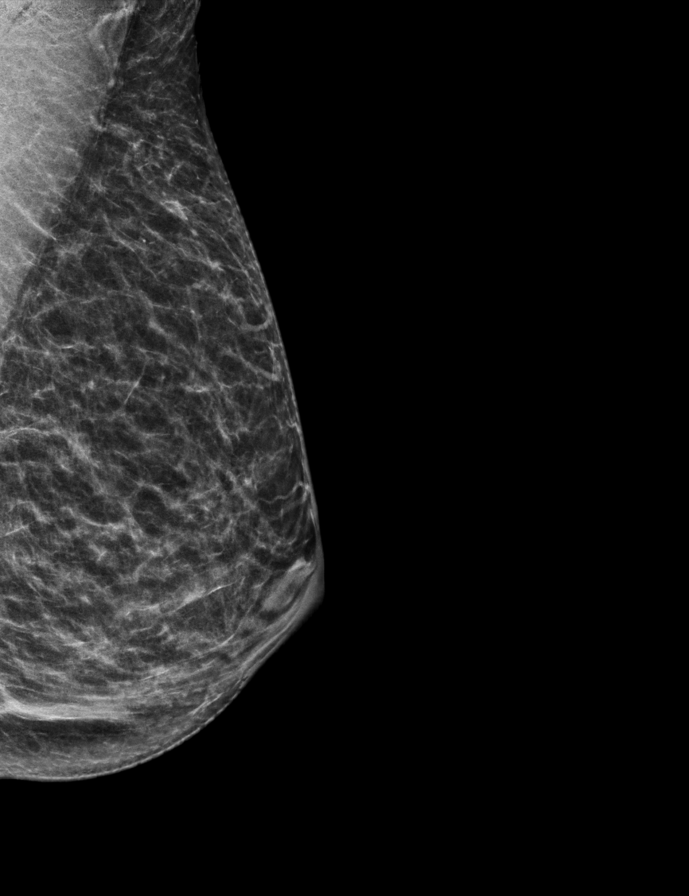

[R CC synth-2D]
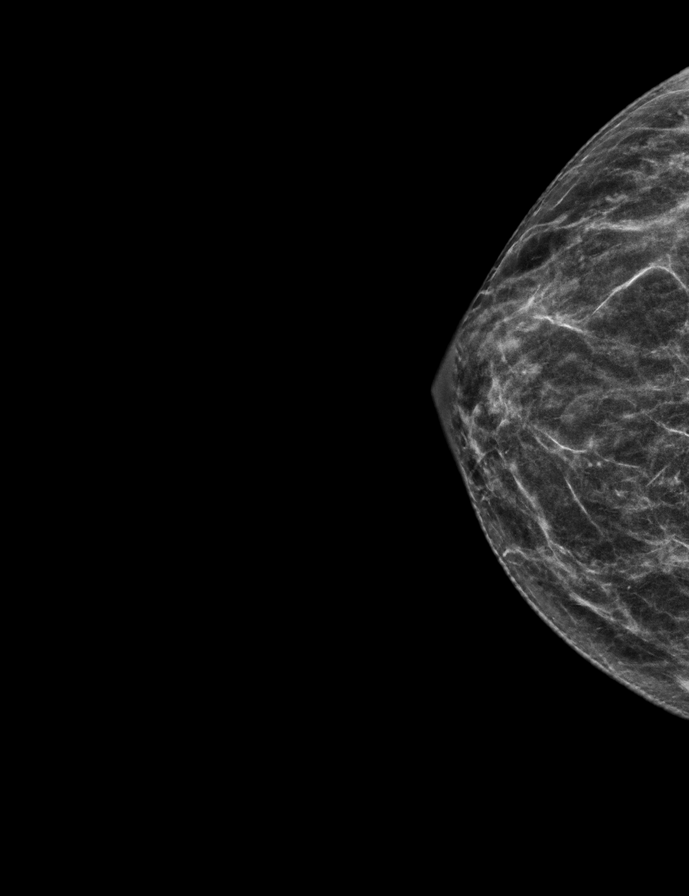

[R MLO synth-2D]
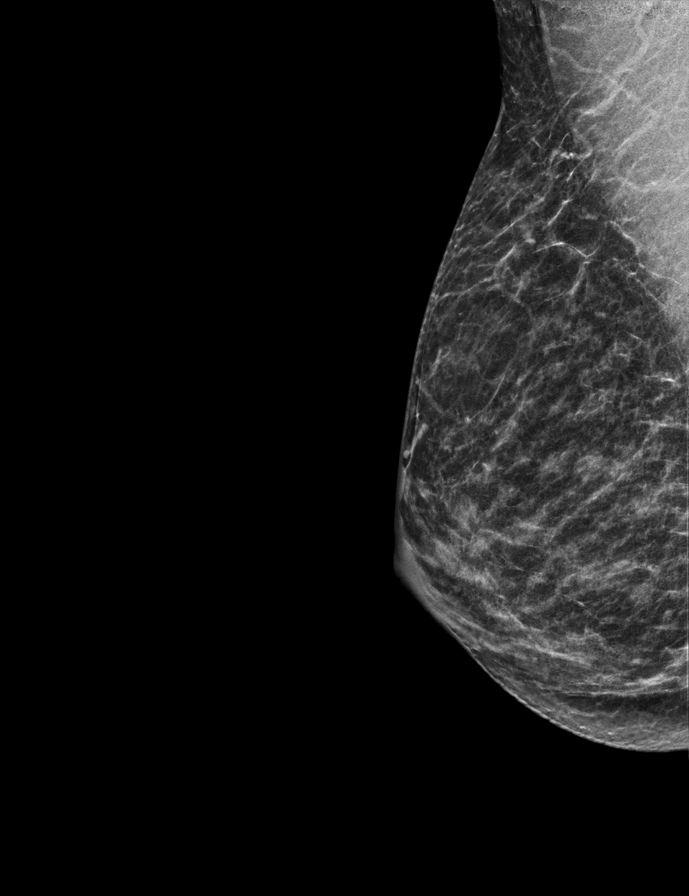

[R CC tomo · 2 of 54 frames shown]
[frame 18/54]
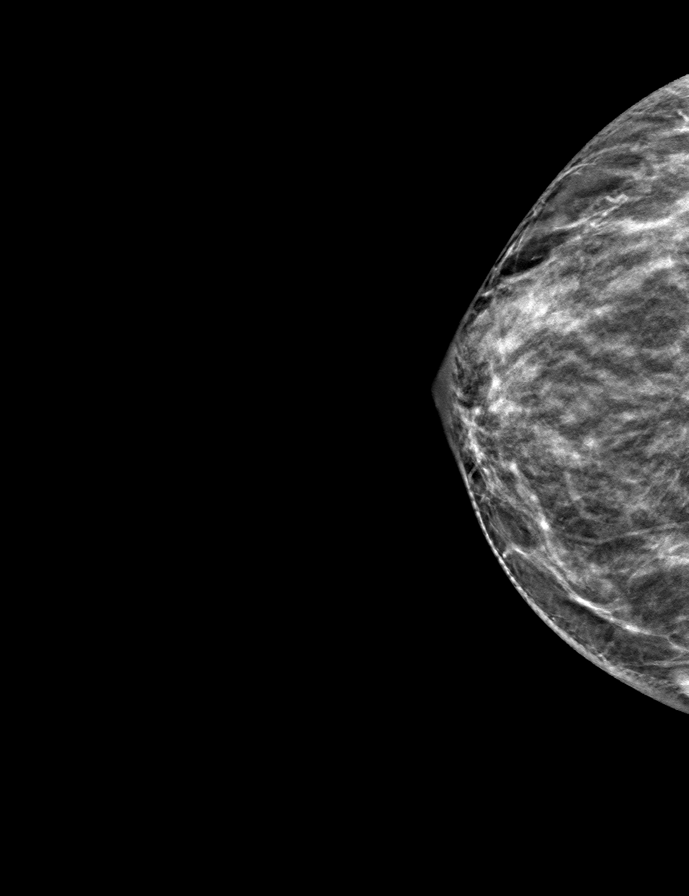
[frame 27/54]
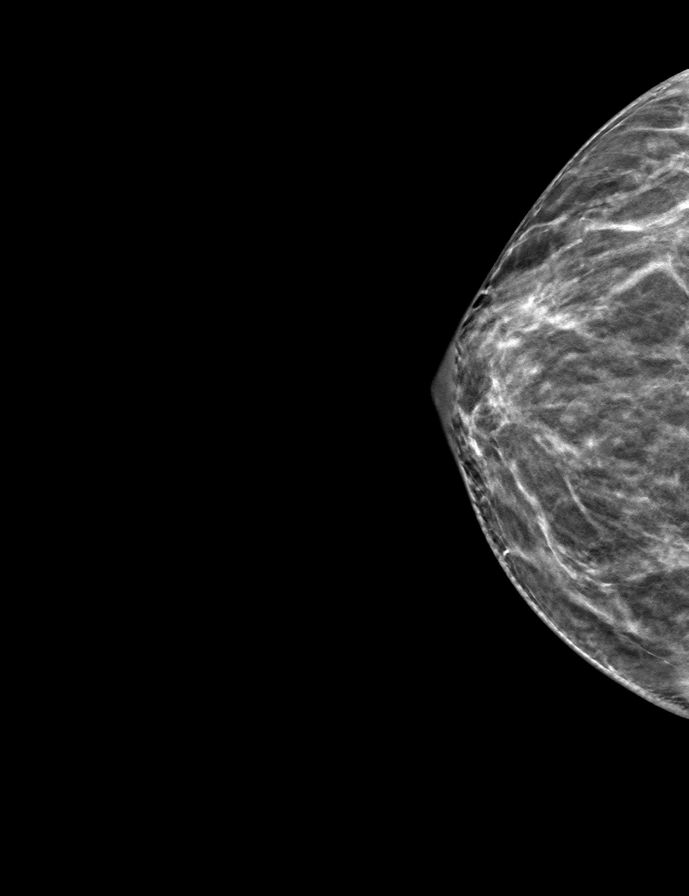

[L MLO tomo · tomo slice 28/55.0]
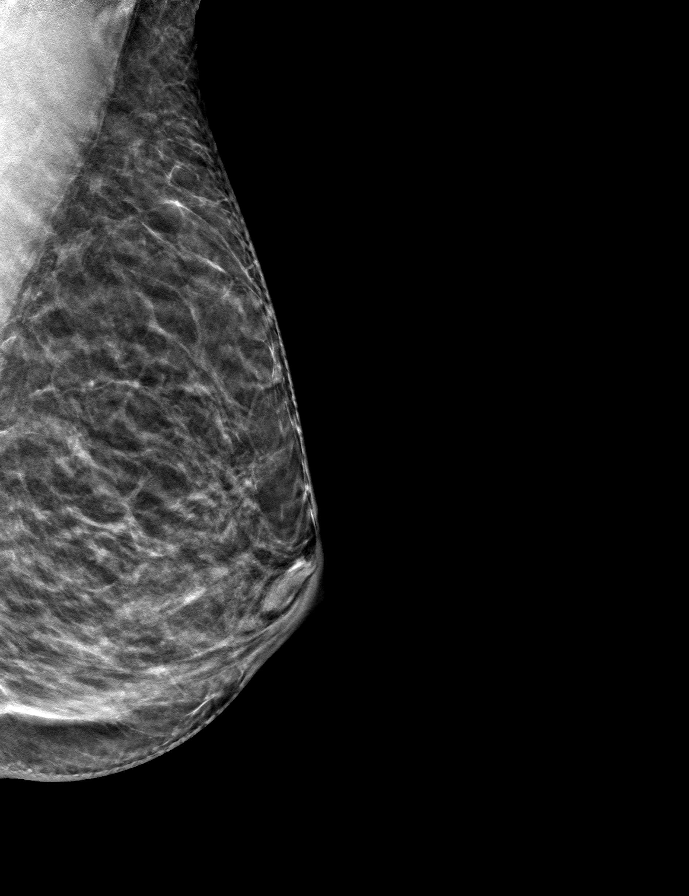

[L CC tomo · tomo slice 29/58.0]
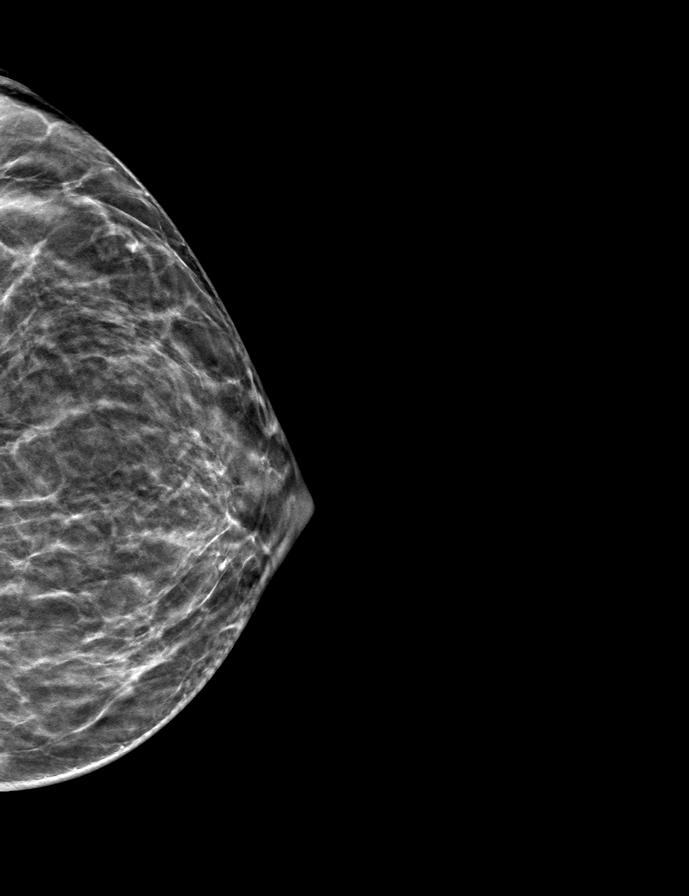

[R MLO tomo · tomo slice 25/49.0]
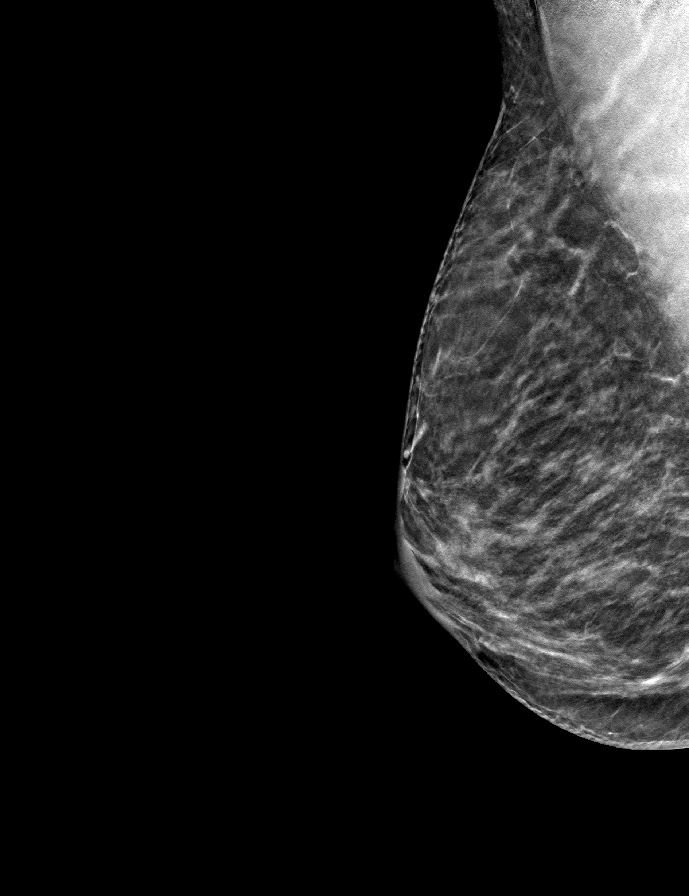

[9 of 24 positions shown; findings below may reference images not displayed]

ACR Breast Density Category b: There are scattered areas of
fibroglandular density.
FINDINGS: There are no findings suspicious for malignancy.
IMPRESSION: No mammographic evidence of malignancy. A result letter of this
screening mammogram will be mailed directly to the patient.

RECOMMENDATION:
Screening mammogram in one year. (Code:51-O-LD2)

BI-RADS CATEGORY  1: Negative.

## 2024-03-02 ENCOUNTER — Other Ambulatory Visit: Payer: Self-pay | Admitting: Gerontology

## 2024-03-02 DIAGNOSIS — E1159 Type 2 diabetes mellitus with other circulatory complications: Secondary | ICD-10-CM | POA: Diagnosis not present

## 2024-03-02 DIAGNOSIS — Z803 Family history of malignant neoplasm of breast: Secondary | ICD-10-CM | POA: Diagnosis not present

## 2024-03-02 DIAGNOSIS — Z1331 Encounter for screening for depression: Secondary | ICD-10-CM | POA: Diagnosis not present

## 2024-03-02 DIAGNOSIS — I152 Hypertension secondary to endocrine disorders: Secondary | ICD-10-CM | POA: Diagnosis not present

## 2024-03-02 DIAGNOSIS — Z1231 Encounter for screening mammogram for malignant neoplasm of breast: Secondary | ICD-10-CM

## 2024-03-02 DIAGNOSIS — R748 Abnormal levels of other serum enzymes: Secondary | ICD-10-CM | POA: Diagnosis not present

## 2024-03-02 DIAGNOSIS — Z0000 Encounter for general adult medical examination without abnormal findings: Secondary | ICD-10-CM | POA: Diagnosis not present

## 2024-03-02 DIAGNOSIS — D649 Anemia, unspecified: Secondary | ICD-10-CM | POA: Diagnosis not present

## 2024-03-02 DIAGNOSIS — K5790 Diverticulosis of intestine, part unspecified, without perforation or abscess without bleeding: Secondary | ICD-10-CM | POA: Diagnosis not present

## 2024-03-02 DIAGNOSIS — Z13220 Encounter for screening for lipoid disorders: Secondary | ICD-10-CM | POA: Diagnosis not present

## 2024-03-02 DIAGNOSIS — J302 Other seasonal allergic rhinitis: Secondary | ICD-10-CM | POA: Diagnosis not present

## 2024-03-02 DIAGNOSIS — Z79899 Other long term (current) drug therapy: Secondary | ICD-10-CM | POA: Diagnosis not present

## 2024-03-02 DIAGNOSIS — Z9189 Other specified personal risk factors, not elsewhere classified: Secondary | ICD-10-CM | POA: Diagnosis not present

## 2024-03-02 DIAGNOSIS — I491 Atrial premature depolarization: Secondary | ICD-10-CM | POA: Diagnosis not present

## 2024-03-02 DIAGNOSIS — Z1329 Encounter for screening for other suspected endocrine disorder: Secondary | ICD-10-CM | POA: Diagnosis not present

## 2024-03-02 DIAGNOSIS — E1169 Type 2 diabetes mellitus with other specified complication: Secondary | ICD-10-CM | POA: Diagnosis not present

## 2024-03-02 DIAGNOSIS — N3281 Overactive bladder: Secondary | ICD-10-CM | POA: Diagnosis not present

## 2024-04-20 NOTE — Progress Notes (Addendum)
 Danielle Keller                                          MRN: [id number]   04/20/2024   The [facility] Quality Team Specialist reviewed this patient medical record for the purposes of chart review for care gap closure. The following were reviewed: abstraction for care gap closure-glycemic status assessment. Also abstracted KED labs for 2025    [facility] Quality Team

## 2024-05-10 ENCOUNTER — Encounter

## 2024-05-13 ENCOUNTER — Ambulatory Visit
Admission: RE | Admit: 2024-05-13 | Discharge: 2024-05-13 | Disposition: A | Discharge status: Home / Self Care | Payer: Self-pay | Source: Ambulatory Visit | Attending: Gerontology | Admitting: Gerontology

## 2024-05-13 DIAGNOSIS — Z1231 Encounter for screening mammogram for malignant neoplasm of breast: Secondary | ICD-10-CM | POA: Diagnosis present
